# Patient Record
Sex: Female | Born: 1975 | Race: Black or African American | Hispanic: No | Marital: Single | Smoking: Never smoker
Health system: Southern US, Community
[De-identification: ages and names within clinical notes are randomized; demographics above are authoritative.]

## PROBLEM LIST (undated history)

## (undated) DIAGNOSIS — I10 Essential (primary) hypertension: Secondary | ICD-10-CM

---

## 2005-03-08 ENCOUNTER — Emergency Department (HOSPITAL_COMMUNITY): Admission: EM | Admit: 2005-03-08 | Discharge: 2005-03-08 | Payer: Self-pay | Admitting: Emergency Medicine

## 2012-07-17 ENCOUNTER — Inpatient Hospital Stay: Payer: Self-pay

## 2012-07-18 LAB — PIH PROFILE
Anion Gap: 9 (ref 7–16)
Chloride: 106 mmol/L (ref 98–107)
Creatinine: 0.69 mg/dL (ref 0.60–1.30)
EGFR (African American): >60
EGFR (Non-African Amer.): >60
MCHC: 33.2 g/dL (ref 32.0–36.0)
RBC: 3.74 10*6/uL — ABNORMAL LOW (ref 3.80–5.20)
Sodium: 137 mmol/L (ref 136–145)
Uric Acid: 3.1 mg/dL (ref 2.6–6.0)
WBC: 11.4 10*3/uL — ABNORMAL HIGH (ref 3.6–11.0)

## 2012-07-20 LAB — HEMATOCRIT: HCT: 28.6 % — ABNORMAL LOW (ref 35.0–47.0)

## 2016-04-19 ENCOUNTER — Encounter: Payer: Self-pay | Admitting: Emergency Medicine

## 2016-04-19 ENCOUNTER — Emergency Department
Admission: EM | Admit: 2016-04-19 | Discharge: 2016-04-19 | Disposition: A | Discharge status: Home / Self Care | Payer: Managed Care, Other (non HMO) | Attending: Emergency Medicine | Admitting: Emergency Medicine

## 2016-04-19 DIAGNOSIS — K529 Noninfective gastroenteritis and colitis, unspecified: Secondary | ICD-10-CM | POA: Diagnosis not present

## 2016-04-19 DIAGNOSIS — R112 Nausea with vomiting, unspecified: Secondary | ICD-10-CM | POA: Diagnosis present

## 2016-04-19 LAB — COMPREHENSIVE METABOLIC PANEL
ALBUMIN: 4.2 g/dL (ref 3.5–5.0)
ALT: 17 U/L (ref 14–54)
AST: 20 U/L (ref 15–41)
Alkaline Phosphatase: 61 U/L (ref 38–126)
Anion gap: 7 (ref 5–15)
BUN: 12 mg/dL (ref 6–20)
CHLORIDE: 106 mmol/L (ref 101–111)
CO2: 26 mmol/L (ref 22–32)
Calcium: 9.1 mg/dL (ref 8.9–10.3)
Creatinine, Ser: 0.97 mg/dL (ref 0.44–1.00)
GFR calc Af Amer: >60 mL/min (ref >60)
GFR calc non Af Amer: >60 mL/min (ref >60)
GLUCOSE: 101 mg/dL — AB (ref 65–99)
POTASSIUM: 4.1 mmol/L (ref 3.5–5.1)
Sodium: 139 mmol/L (ref 135–145)
Total Bilirubin: 0.7 mg/dL (ref 0.3–1.2)
Total Protein: 7.8 g/dL (ref 6.5–8.1)

## 2016-04-19 LAB — URINALYSIS COMPLETE WITH MICROSCOPIC (ARMC ONLY)
BILIRUBIN URINE: NEGATIVE
Glucose, UA: NEGATIVE mg/dL
Hgb urine dipstick: NEGATIVE
Leukocytes, UA: NEGATIVE
Nitrite: NEGATIVE
PH: 5 (ref 5.0–8.0)
PROTEIN: 30 mg/dL — AB
Specific Gravity, Urine: 1.029 (ref 1.005–1.030)

## 2016-04-19 LAB — CBC
HCT: 44 % (ref 35.0–47.0)
HEMOGLOBIN: 14.9 g/dL (ref 12.0–16.0)
MCH: 31 pg (ref 26.0–34.0)
MCHC: 34 g/dL (ref 32.0–36.0)
MCV: 91.1 fL (ref 80.0–100.0)
Platelets: 262 10*3/uL (ref 150–440)
RBC: 4.83 MIL/uL (ref 3.80–5.20)
RDW: 13 % (ref 11.5–14.5)
WBC: 15.1 10*3/uL — ABNORMAL HIGH (ref 3.6–11.0)

## 2016-04-19 LAB — LIPASE, BLOOD: Lipase: 25 U/L (ref 11–51)

## 2016-04-19 MED ORDER — METOCLOPRAMIDE HCL 10 MG PO TABS: 10.0000 mg | ORAL_TABLET | Freq: Three times a day (TID) | ORAL | 0 refills | Status: AC | PRN

## 2016-04-19 MED ORDER — DICYCLOMINE HCL 10 MG PO CAPS: 10.0000 mg | ORAL_CAPSULE | Freq: Three times a day (TID) | ORAL | 0 refills | Status: AC | PRN

## 2016-04-19 MED ORDER — METOCLOPRAMIDE HCL 5 MG/ML IJ SOLN
10.0000 mg | Freq: Once | INTRAMUSCULAR | Status: AC
  Administered 2016-04-19: 10 mg via INTRAVENOUS
  Filled 2016-04-19 (×2): qty 2

## 2016-04-19 MED ORDER — DICYCLOMINE HCL 10 MG PO CAPS
10.0000 mg | ORAL_CAPSULE | Freq: Once | ORAL | Status: AC
  Administered 2016-04-19: 10 mg via ORAL
  Filled 2016-04-19 (×2): qty 1

## 2016-04-19 MED ORDER — ONDANSETRON HCL 4 MG/2ML IJ SOLN
4.0000 mg | Freq: Once | INTRAMUSCULAR | Status: AC | PRN
  Administered 2016-04-19: 4 mg via INTRAVENOUS
  Filled 2016-04-19: qty 2

## 2016-04-19 NOTE — ED Provider Notes (Signed)
[facility] Emergency Department Provider Note   ____________________________________________   I have reviewed the triage vital signs and the nursing notes.   HISTORY  Chief Complaint Weakness; Nausea; Emesis; and Diarrhea   History limited by: Not Limited   HPI Yvonne Stephens is a 40 y.o. female who presents to the emergency department today because of concern for nausea, vomiting and diarrhea. Symptoms started this morning. She has had multiple episodes of both. Has not noticed any blood in either. This has been associated with some abdominal cramping. Patient thinks that she might have gotten food poisoning from store bought mushrooms that she ate last night. No fevers.   History reviewed. No pertinent past medical history.  There are no active problems to display for this patient.   History reviewed. No pertinent surgical history.  Prior to Admission medications   Not on File    Allergies Review of patient's allergies indicates no known allergies.  History reviewed. No pertinent family history.  Social History Social History  Substance Use Topics  . Smoking status: Never Smoker  . Smokeless tobacco: Never Used  . Alcohol use Yes    Review of Systems  Constitutional: Negative for fever. Cardiovascular: Negative for chest pain. Respiratory: Negative for shortness of breath. Gastrointestinal: Positive for abdominal cramping, nausea and vomiting. Genitourinary: Negative for dysuria. Musculoskeletal: Negative for back pain. Skin: Negative for rash. Neurological: Negative for headaches, focal weakness or numbness.  10-point ROS otherwise negative.  ____________________________________________   PHYSICAL EXAM:  VITAL SIGNS: ED Triage Vitals  Enc Vitals Group     BP 04/19/16 1138 (!) 176/103     Pulse Rate 04/19/16 1138 84     Resp 04/19/16 1138 18     Temp 04/19/16 1138 97.6 F (36.4 C)     Temp Source 04/19/16 1138 Oral      SpO2 04/19/16 1138 99 %     Weight 04/19/16 1139 219 lb (99.3 kg)     Height 04/19/16 1139 5\' 10"  (1.778 m)     Head Circumference --      Peak Flow --      Pain Score 04/19/16 1142 4   Constitutional: Alert and oriented. Well appearing and in no distress. Eyes: Conjunctivae are normal. Normal extraocular movements. ENT   Head: Normocephalic and atraumatic.   Nose: No congestion/rhinnorhea.   Mouth/Throat: Mucous membranes are moist.   Neck: No stridor. Hematological/Lymphatic/Immunilogical: No cervical lymphadenopathy. Cardiovascular: Normal rate, regular rhythm.  No murmurs, rubs, or gallops.  Respiratory: Normal respiratory effort without tachypnea nor retractions. Breath sounds are clear and equal bilaterally. No wheezes/rales/rhonchi. Gastrointestinal: Soft and nontender. No distention.  Genitourinary: Deferred Musculoskeletal: Normal range of motion in all extremities. No lower extremity edema. Neurologic:  Normal speech and language. No gross focal neurologic deficits are appreciated.  Skin:  Skin is warm, dry and intact. No rash noted. Psychiatric: Mood and affect are normal. Speech and behavior are normal. Patient exhibits appropriate insight and judgment.  ____________________________________________    LABS (pertinent positives/negatives)  Labs Reviewed  CBC - Abnormal; Notable for the following:       Result Value   WBC 15.1 (*)    All other components within normal limits  URINALYSIS COMPLETEWITH MICROSCOPIC ([facility] ONLY) - Abnormal; Notable for the following:    Color, Urine YELLOW (*)    APPearance TURBID (*)    Ketones, ur TRACE (*)    Protein, ur 30 (*)    Bacteria, UA FEW (*)  Squamous Epithelial / LPF 6-30 (*)    All other components within normal limits  COMPREHENSIVE METABOLIC PANEL - Abnormal; Notable for the following:    Glucose, Bld 101 (*)    All other components within normal limits  LIPASE, BLOOD  POC URINE PREG, ED      ____________________________________________   EKG  None  ____________________________________________    RADIOLOGY  None  ____________________________________________   PROCEDURES  Procedures  ____________________________________________   INITIAL IMPRESSION / ASSESSMENT AND PLAN / ED COURSE  Pertinent labs & imaging results that were available during my care of the patient were reviewed by me and considered in my medical decision making (see chart for details).  Patient states that she feels better after medication. Was able to drink some water. Will plan on discharging home with prescriptions for antiemetics, and bentyl. ____________________________________________   FINAL CLINICAL IMPRESSION(S) / ED DIAGNOSES  Final diagnoses:  Gastroenteritis     Note: This dictation was prepared with Dragon dictation. Any transcriptional errors that result from this process are unintentional    Phineas Semen, MD 04/19/16 1553

## 2016-04-19 NOTE — ED Triage Notes (Signed)
Pt to ed with c/o weakness and n/v/d this am.  Pt states she has been vomiting multiple times this am and had diarrhea multiple times this am. Pt brought to ed by EMS>

## 2016-04-19 NOTE — ED Notes (Signed)
Pt alert and oriented X4, active, cooperative, pt in NAD. RR even and unlabored, color WNL.  Pt informed to return if any life threatening symptoms occur.   

## 2016-04-19 NOTE — Discharge Instructions (Signed)
Please seek medical attention for any high fevers, chest pain, shortness of breath, change in behavior, persistent vomiting, bloody stool or any other new or concerning symptoms.  

## 2016-12-13 ENCOUNTER — Emergency Department
Admission: EM | Admit: 2016-12-13 | Discharge: 2016-12-13 | Disposition: A | Discharge status: Home / Self Care | Payer: Managed Care, Other (non HMO) | Attending: Emergency Medicine | Admitting: Emergency Medicine

## 2016-12-13 ENCOUNTER — Encounter: Payer: Self-pay | Admitting: Emergency Medicine

## 2016-12-13 ENCOUNTER — Emergency Department: Payer: Managed Care, Other (non HMO)

## 2016-12-13 DIAGNOSIS — R51 Headache: Secondary | ICD-10-CM | POA: Diagnosis not present

## 2016-12-13 DIAGNOSIS — Z79899 Other long term (current) drug therapy: Secondary | ICD-10-CM | POA: Diagnosis not present

## 2016-12-13 DIAGNOSIS — I10 Essential (primary) hypertension: Secondary | ICD-10-CM

## 2016-12-13 HISTORY — DX: Essential (primary) hypertension: I10

## 2016-12-13 LAB — URINALYSIS, COMPLETE (UACMP) WITH MICROSCOPIC
BILIRUBIN URINE: NEGATIVE
GLUCOSE, UA: NEGATIVE mg/dL
Hgb urine dipstick: NEGATIVE
KETONES UR: NEGATIVE mg/dL
LEUKOCYTES UA: NEGATIVE
Nitrite: NEGATIVE
PH: 6 (ref 5.0–8.0)
Protein, ur: NEGATIVE mg/dL
RBC / HPF: NONE SEEN RBC/hpf (ref 0–5)
SPECIFIC GRAVITY, URINE: 1.017 (ref 1.005–1.030)

## 2016-12-13 LAB — BASIC METABOLIC PANEL
ANION GAP: 5 (ref 5–15)
BUN: 11 mg/dL (ref 6–20)
CALCIUM: 9 mg/dL (ref 8.9–10.3)
CO2: 27 mmol/L (ref 22–32)
Chloride: 102 mmol/L (ref 101–111)
Creatinine, Ser: 0.79 mg/dL (ref 0.44–1.00)
GFR calc Af Amer: >60 mL/min (ref >60)
Glucose, Bld: 86 mg/dL (ref 65–99)
POTASSIUM: 3.9 mmol/L (ref 3.5–5.1)
SODIUM: 134 mmol/L — AB (ref 135–145)

## 2016-12-13 LAB — CBC
HCT: 40 % (ref 35.0–47.0)
Hemoglobin: 13.8 g/dL (ref 12.0–16.0)
MCH: 30.9 pg (ref 26.0–34.0)
MCHC: 34.6 g/dL (ref 32.0–36.0)
MCV: 89.5 fL (ref 80.0–100.0)
PLATELETS: 282 10*3/uL (ref 150–440)
RBC: 4.47 MIL/uL (ref 3.80–5.20)
RDW: 13.1 % (ref 11.5–14.5)
WBC: 8 10*3/uL (ref 3.6–11.0)

## 2016-12-13 LAB — POCT PREGNANCY, URINE: Preg Test, Ur: NEGATIVE

## 2016-12-13 MED ORDER — LOSARTAN POTASSIUM-HCTZ 50-12.5 MG PO TABS: 1.0000 | ORAL_TABLET | Freq: Every day | ORAL | 1 refills | Status: AC

## 2016-12-13 NOTE — ED Triage Notes (Signed)
Pt to ed with c/o HTN.  Pt states she has been on bp meds in the past but ran out of meds about 2 months ago.  Pt went to urgent care today and was brought here for eval.  Pt reports headache for several weeks, intermittent blurred vision.  Denies blurred vision at this time, does report headache to left side of head.

## 2016-12-13 NOTE — Discharge Instructions (Signed)
As we discussed, though you do have high blood pressure (hypertension), fortunately it is not immediately dangerous at this time and does not need emergency intervention or admission to the hospital.  We checked in the computer and verified the medication you were on previously and have given you a new prescription.  Please take it according to the label instructions.  Please follow up in clinic as recommended in these papers.    Return to the Emergency Department (ED) if you experience any worsening chest pain/pressure/tightness, difficulty breathing, or sudden sweating, or other symptoms that concern you.

## 2016-12-13 NOTE — ED Provider Notes (Signed)
[facility] Emergency Department Provider Note  ____________________________________________   First MD Initiated Contact with Patient 12/13/16 1515     (approximate)  I have reviewed the triage vital signs and the nursing notes.   HISTORY  Chief Complaint Hypertension    HPI Yvonne Stephens is a 41 y.o. female with a history of hypertension previously on medication but who has been out of meds for at least 2 months.  She was sent over from the [place] [facility] urgent care for further evaluation of elevated blood pressure.  She reports a variety of intermittent complaints including intermittent headache, intermittent blurred vision, and occasional nausea.  She reports the symptoms are mild and she had exactly the same symptoms months ago when she was diagnosed with hypertension and started on medications.  She is in between primary care doctors and went to the [facility] urgent care today to get set up with a primary care doctor.  They did set her up with an appointment in 9 days with a new provider but said they could not prescribe any medications for her and sent her to the emergency department.  She states that she did not want to come but she does want to get started on medicine and that was her only choice.    Nothing in particular makes the patient's symptoms better nor worse.  She denies any focal numbness nor weakness, is not having any difficulty with walking, currently is asymptomatic except for some mild left-sided headache.  She denies fever/chills, chest pain or shortness of breath, vomiting, abdominal pain, dysuria.  She describes the symptoms as moderate at worst and currently they are mild.   Past Medical History:  Diagnosis Date  . Hypertension     There are no active problems to display for this patient.   History reviewed. No pertinent surgical history.  Prior to Admission medications   Medication Sig Start Date End Date Taking?  Authorizing Provider  dicyclomine (BENTYL) 10 MG capsule Take 1 capsule (10 mg total) by mouth 3 (three) times daily as needed for spasms (abdominal cramping). 04/19/16 05/03/16  Phineas Semen, MD  losartan-hydrochlorothiazide (HYZAAR) 50-12.5 MG tablet Take 1 tablet by mouth daily. 12/13/16   Loleta Rose, MD  metoCLOPramide (REGLAN) 10 MG tablet Take 1 tablet (10 mg total) by mouth every 8 (eight) hours as needed for nausea or vomiting. 04/19/16 04/19/17  Phineas Semen, MD    Allergies Patient has no known allergies.  History reviewed. No pertinent family history.  Social History Social History  Substance Use Topics  . Smoking status: Never Smoker  . Smokeless tobacco: Never Used  . Alcohol use Yes    Review of Systems Constitutional: No fever/chills Eyes: Intermittent blurred vision ENT: No sore throat. Cardiovascular: Denies chest pain. Respiratory: Denies shortness of breath. Gastrointestinal: No abdominal pain.  Occasional nausea, no vomiting.  No diarrhea.  No constipation. Genitourinary: Negative for dysuria. Musculoskeletal: Negative for neck pain.  Negative for back pain. Integumentary: Negative for rash. Neurological: Intermittent headaches, no focal weakness or numbness.   ____________________________________________   PHYSICAL EXAM:  VITAL SIGNS: ED Triage Vitals  Enc Vitals Group     BP 12/13/16 1247 (!) 167/118     Pulse Rate 12/13/16 1247 93     Resp 12/13/16 1247 18     Temp 12/13/16 1247 98.1 F (36.7 C)     Temp Source 12/13/16 1247 Oral     SpO2 12/13/16 1247 100 %     Weight  12/13/16 1248 99.3 kg (219 lb)     Height --      Head Circumference --      Peak Flow --      Pain Score --      Pain Loc --      Pain Edu? --      Excl. in GC? --     Constitutional: Alert and oriented. Well appearing and in no acute distress. Eyes: Conjunctivae are normal. PERRL. EOMI.  No papilledema on funduscopic exam Head: Atraumatic. Nose: No  congestion/rhinnorhea. Mouth/Throat: Mucous membranes are moist. Neck: No stridor.  No meningeal signs.   Cardiovascular: Normal rate, regular rhythm. Good peripheral circulation.  Respiratory: Normal respiratory effort.  No retractions.  Musculoskeletal: No lower extremity tenderness nor edema. No gross deformities of extremities. Neurologic:  Normal speech and language. No gross focal neurologic deficits are appreciated.  Steady gait, ambulates without difficulty Skin:  Skin is warm, dry and intact. No rash noted. Psychiatric: Mood and affect are normal. Speech and behavior are normal.  ____________________________________________   LABS (all labs ordered are listed, but only abnormal results are displayed)  Labs Reviewed  BASIC METABOLIC PANEL - Abnormal; Notable for the following:       Result Value   Sodium 134 (*)    All other components within normal limits  URINALYSIS, COMPLETE (UACMP) WITH MICROSCOPIC - Abnormal; Notable for the following:    Color, Urine YELLOW (*)    APPearance CLEAR (*)    Bacteria, UA RARE (*)    Squamous Epithelial / LPF 0-5 (*)    All other components within normal limits  CBC  POCT PREGNANCY, URINE   ____________________________________________  EKG  ED ECG REPORT I, Winnifred Dufford, the attending physician, personally viewed and interpreted this ECG.  Date: 12/13/2016 EKG Time: 13:16 Rate: 90 Rhythm: normal sinus rhythm QRS Axis: normal Intervals: Incomplete right bundle branch block, slightly prolonged QTC at 504 ms ST/T Wave abnormalities: Non-specific ST segment / T-wave changes, but no evidence of acute ischemia. Narrative Interpretation: No evidence of acute ischemia  ____________________________________________  RADIOLOGY   Ct Head Wo Contrast  Result Date: 12/13/2016 CLINICAL DATA:  Headache. EXAM: CT HEAD WITHOUT CONTRAST TECHNIQUE: Contiguous axial images were obtained from the base of the skull through the vertex without  intravenous contrast. COMPARISON:  None. FINDINGS: Brain: No evidence of acute infarction, hemorrhage, hydrocephalus, extra-axial collection or mass lesion/mass effect. Vascular: No hyperdense vessel or unexpected calcification. Skull: Normal. Negative for fracture or focal lesion. Sinuses/Orbits: No acute finding. Other: None. IMPRESSION: Normal head CT. Electronically Signed   By: Lupita Raider, M.D.   On: 12/13/2016 13:43    ____________________________________________   PROCEDURES  Critical Care performed: No   Procedure(s) performed:   Procedures   ____________________________________________   INITIAL IMPRESSION / ASSESSMENT AND PLAN / ED COURSE  Pertinent labs & imaging results that were available during my care of the patient were reviewed by me and considered in my medical decision making (see chart for details).   Clinical Course as of Dec 14 1538  Tue Dec 13, 2016  1527 The patient is alert and oriented and in no acute distress with no symptoms at this time, no chest pain, no focal neurological symptoms.  She has well-documented hypertension the past but has been off her medicine for 2 months and her symptoms were the same back when she was first diagnosed and they went away when she was on medication.  I verified in care  everywhere that the medicine she was on previously was losartan 50/HCTZ 12.5 and it worked well for her.  She has a follow-up appointment in 9 days with a new primary care doctor.  We discussed the plan and she is very comfortable with a prescription which she will fill this and she leaves here and outpatient follow-up.  She needs to get to work so she does not want to stay for a dose of medication, which I think is appropriate since she can go directly to the pharmacy.  I gave my usual and customary return precautions.  She has no evidence of acute medical emergency at this time and has appropriate outpatient follow-up.  [CF]    Clinical Course User  Index [CF] Loleta RoseForbach, Mylah Baynes, MD    ____________________________________________  FINAL CLINICAL IMPRESSION(S) / ED DIAGNOSES  Final diagnoses:  Essential hypertension     MEDICATIONS GIVEN DURING THIS VISIT:  Medications - No data to display   NEW OUTPATIENT MEDICATIONS STARTED DURING THIS VISIT:  New Prescriptions   LOSARTAN-HYDROCHLOROTHIAZIDE (HYZAAR) 50-12.5 MG TABLET    Take 1 tablet by mouth daily.    Modified Medications   No medications on file    Discontinued Medications   No medications on file     Note:  This document was prepared using Dragon voice recognition software and may include unintentional dictation errors.    Loleta RoseForbach, Cheyenne Schumm, MD 12/13/16 1540

## 2016-12-13 NOTE — ED Notes (Signed)
Pt out of BP medication x 2 months; reports going to [facility] to get prescription but was sent over here.  Pt denies any headaches, dizziness, changes in vision at this time.

## 2018-10-13 IMAGING — CT CT HEAD W/O CM
3 series · 16 of 47 positions shown, 19 images · non-contrast
Comparison: None.

CLINICAL DATA: Headache.

EXAM:
CT HEAD WITHOUT CONTRAST
TECHNIQUE: Contiguous axial images were obtained from the base of the skull
through the vertex without intravenous contrast.

[Series 2: head wo · axial · 0.41mm/px · z∈[+183,+308]mm · 10 of 30 slices shown, 13 images]
[im 3/30  brain]
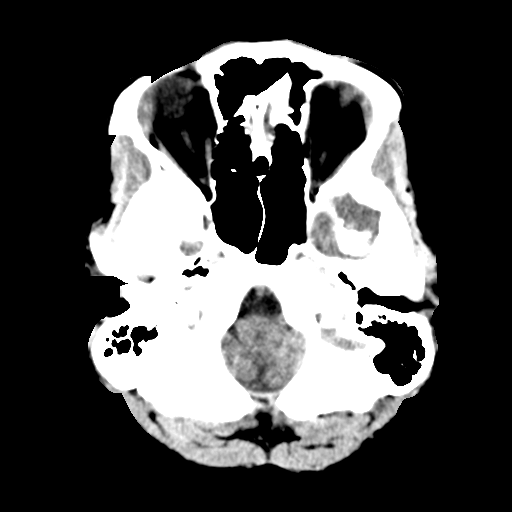
[im 3/30  bone]
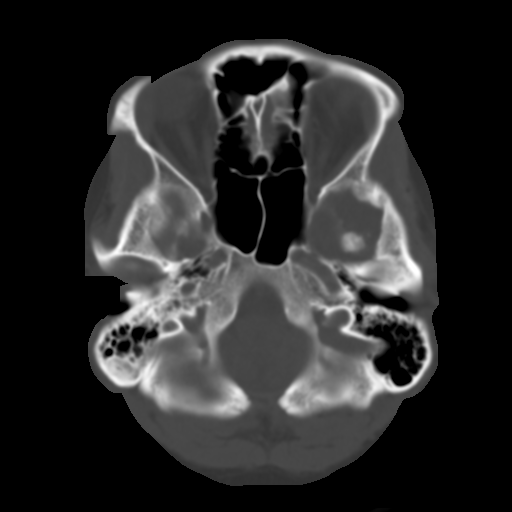
[im 6/30  brain]
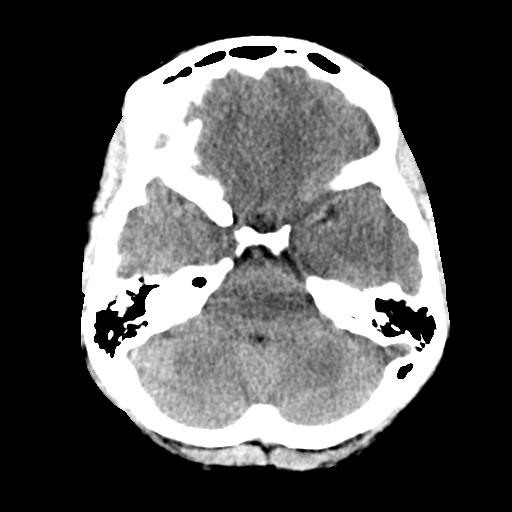
[im 9/30  brain]
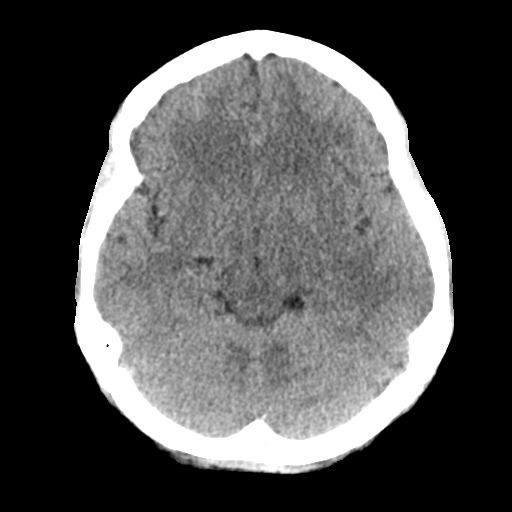
[im 11/30  brain]
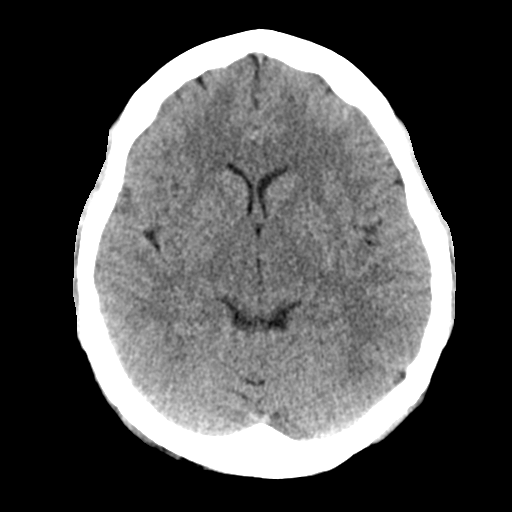
[im 14/30  brain]
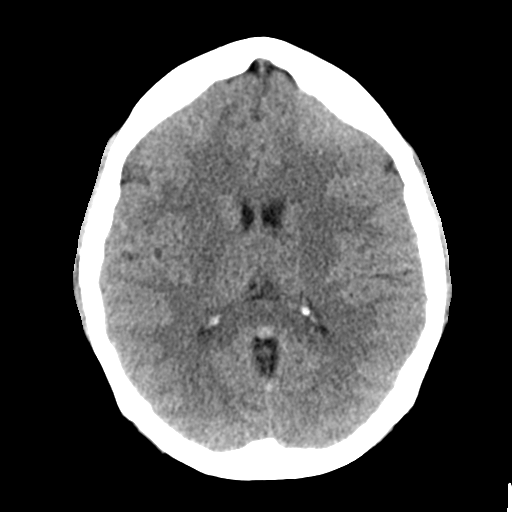
[im 14/30  bone]
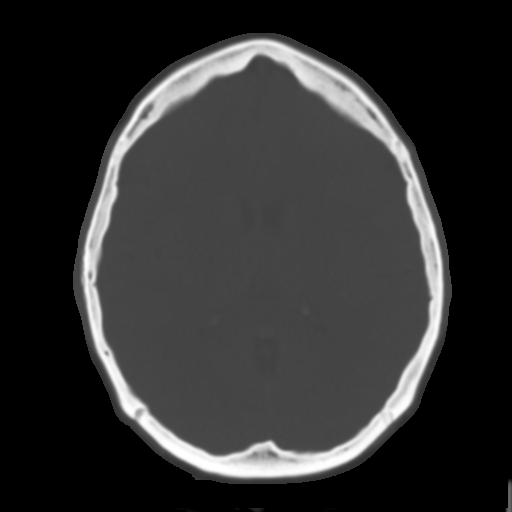
[im 17/30  brain]
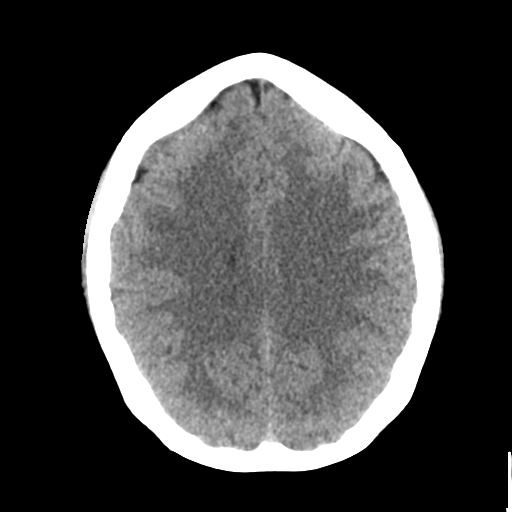
[im 20/30  brain]
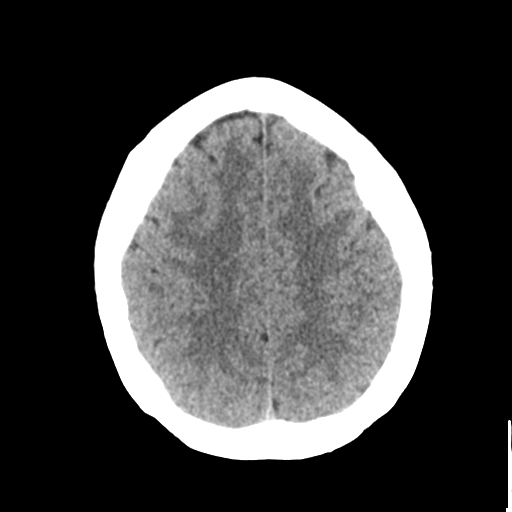
[im 23/30  brain]
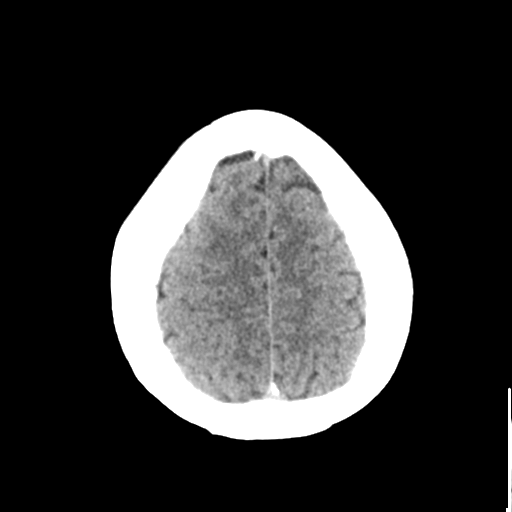
[im 25/30  brain]
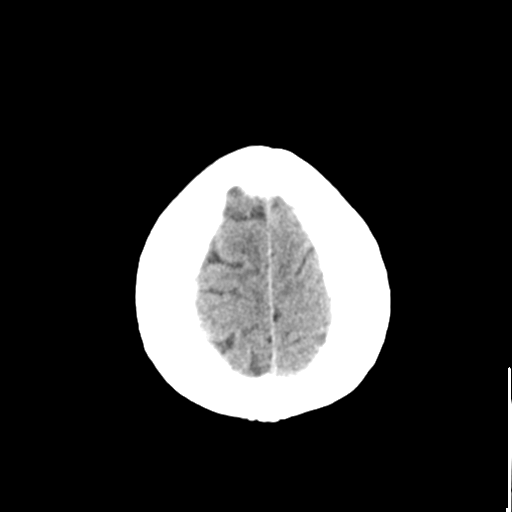
[im 25/30  bone]
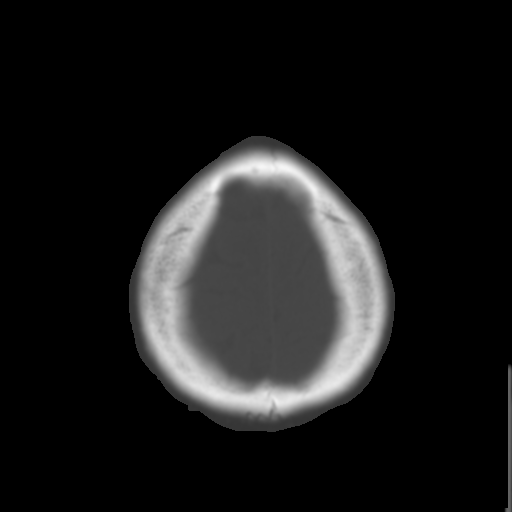
[im 28/30  brain]
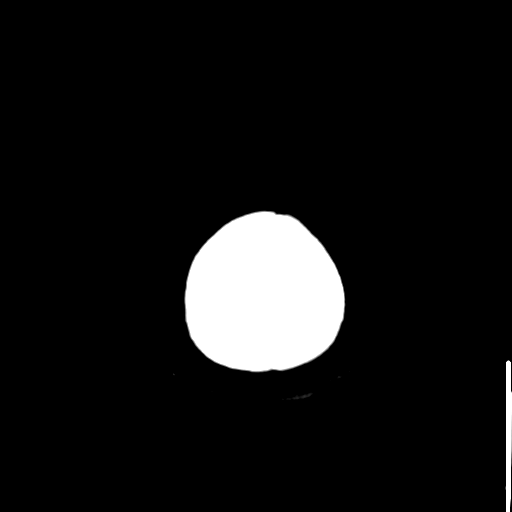

[Series 4: coronal soft tissue · coronal · 0.32mm/px · 3 of 69 slices shown]
[im 23/69  brain]
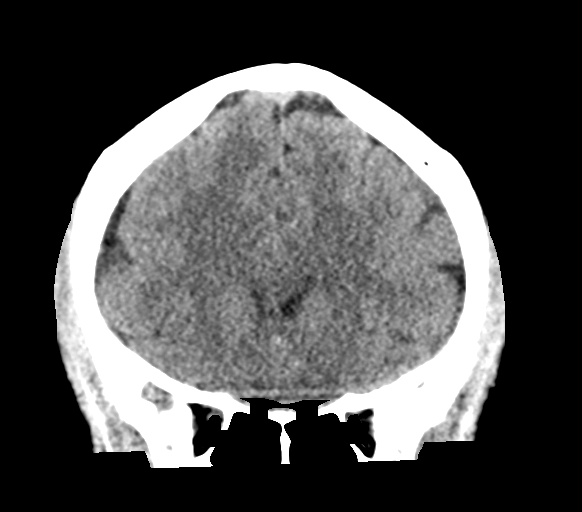
[im 31/69  brain]
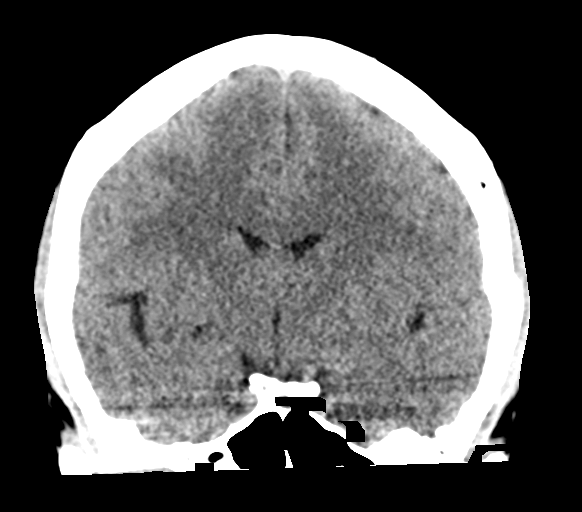
[im 38/69  brain]
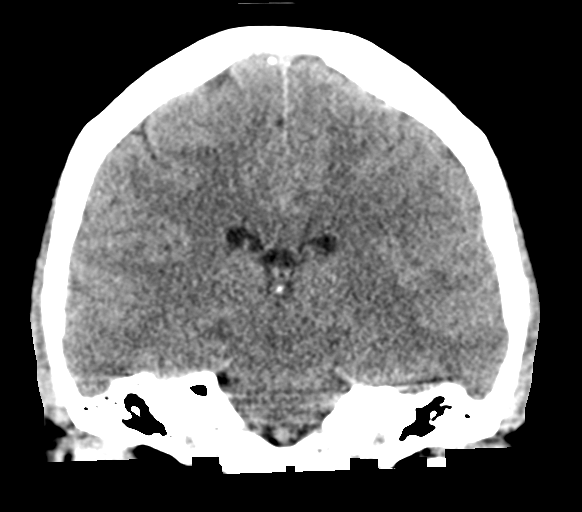

[Series 5: sagittal soft tissue · sagittal · 0.31mm/px · 3 of 60 slices shown]
[im 20/60  brain]
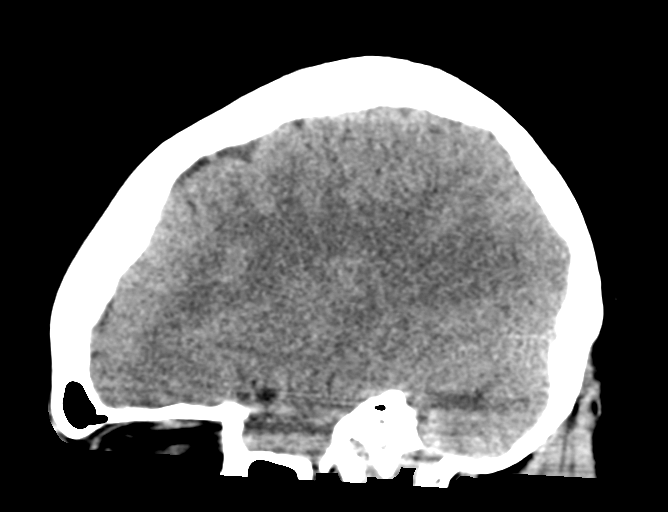
[im 30/60  brain]
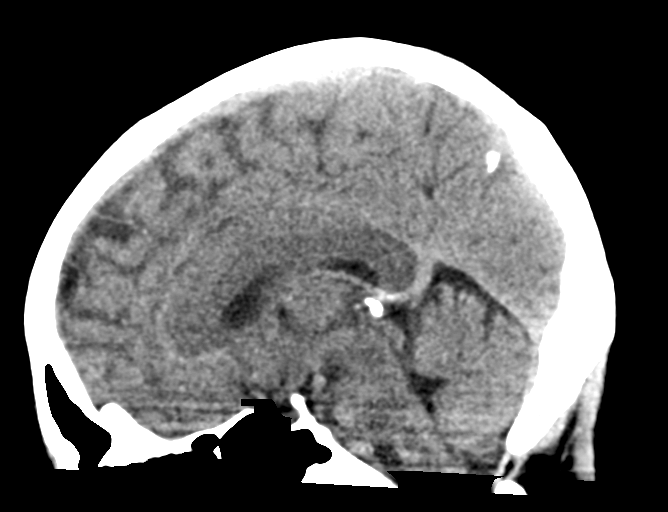
[im 40/60  brain]
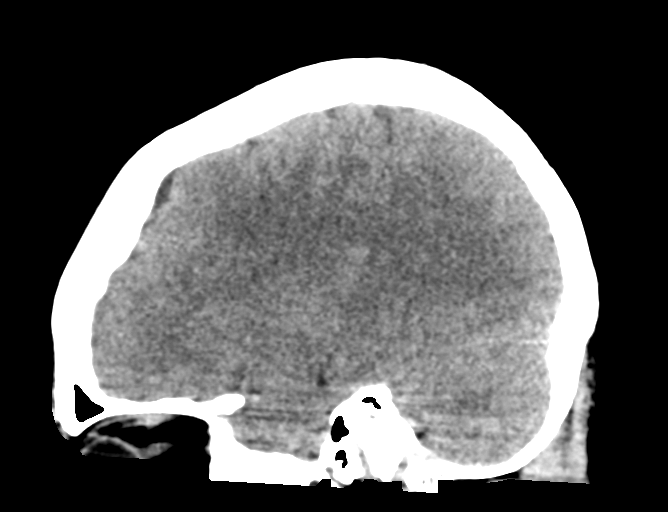

[16 of 47 positions shown; findings below may reference images not displayed]

FINDINGS: Brain: No evidence of acute infarction, hemorrhage, hydrocephalus,
extra-axial collection or mass lesion/mass effect.

Vascular: No hyperdense vessel or unexpected calcification.

Skull: Normal. Negative for fracture or focal lesion.

Sinuses/Orbits: No acute finding.

Other: None.
IMPRESSION: Normal head CT.
